# Patient Record
Sex: Male | Born: 1970 | Race: Black or African American | Hispanic: No | Marital: Married
Health system: Southern US, Community
[De-identification: ages and names within clinical notes are randomized; demographics above are authoritative.]

---

## 2014-07-22 ENCOUNTER — Emergency Department: Payer: Self-pay | Admitting: Emergency Medicine

## 2015-11-25 ENCOUNTER — Emergency Department
Admission: EM | Admit: 2015-11-25 | Discharge: 2015-11-25 | Disposition: A | Discharge status: Home / Self Care | Payer: BLUE CROSS/BLUE SHIELD | Attending: Emergency Medicine | Admitting: Emergency Medicine

## 2015-11-25 ENCOUNTER — Encounter: Payer: Self-pay | Admitting: Emergency Medicine

## 2015-11-25 DIAGNOSIS — H6092 Unspecified otitis externa, left ear: Secondary | ICD-10-CM | POA: Diagnosis not present

## 2015-11-25 DIAGNOSIS — H6123 Impacted cerumen, bilateral: Secondary | ICD-10-CM | POA: Insufficient documentation

## 2015-11-25 DIAGNOSIS — H9202 Otalgia, left ear: Secondary | ICD-10-CM | POA: Diagnosis present

## 2015-11-25 MED ORDER — NEOMYCIN-POLYMYXIN-HC 3.5-10000-1 OT SOLN: 3.0000 [drp] | Freq: Three times a day (TID) | OTIC | Status: AC

## 2015-11-25 NOTE — ED Notes (Signed)
Patient ambulatory to triage with steady gait, without difficulty or distress noted; pt reports left earache with muffled hearing since going swimming on Saturday

## 2015-11-25 NOTE — Discharge Instructions (Signed)
Ear Drops, Adult °You have been diagnosed with a condition requiring you to put drops of medicine into your outer ear. °HOME CARE INSTRUCTIONS  °· Put drops in the affected ear as instructed. After putting the drops in, you will need to lie down with the affected ear facing up for ten minutes so the drops will remain in the ear canal and run down and fill the canal. Continue using the ear drops for as long as directed by your health care provider. °· Prior to getting up, put a cotton ball gently in your ear canal. Leave enough of the cotton ball out so it can be easily removed. Do not attempt to push this down into the canal with a cotton-tipped swab or other instrument. °· Do not irrigate or wash out your ears if you have had a perforated eardrum or mastoid surgery, or unless instructed to do so by your health care provider. °· Keep appointments with your health care provider as instructed. °· Finish all medicine, or use for the length of time prescribed by your health care provider. Continue the drops even if your problem seems to be doing well after a couple days, or continue as instructed. °SEEK MEDICAL CARE IF: °· You become worse or develop increasing pain. °· You notice any unusual drainage from your ear (particularly if the drainage has a bad smell). °· You develop hearing difficulties. °· You experience a serious form of dizziness in which you feel as if the room is spinning, and you feel nauseated (vertigo). °· The outside of your ear becomes red or swollen or both. This may be a sign of an allergic reaction. °MAKE SURE YOU:  °· Understand these instructions. °· Will watch your condition. °· Will get help right away if you are not doing well or get worse. °  °This information is not intended to replace advice given to you by your health care provider. Make sure you discuss any questions you have with your health care provider. °  °Document Released: 05/09/2001 Document Revised: 06/05/2014 Document  Reviewed: 12/10/2012 °Elsevier Interactive Patient Education ©2016 [employer] ° °Otitis Externa °Otitis externa is a bacterial or fungal infection of the outer ear canal. This is the area from the eardrum to the outside of the ear. Otitis externa is sometimes called "swimmer's ear." °CAUSES  °Possible causes of infection include: °· Swimming in dirty water. °· Moisture remaining in the ear after swimming or bathing. °· Mild injury (trauma) to the ear. °· Objects stuck in the ear (foreign body). °· Cuts or scrapes (abrasions) on the outside of the ear. °SIGNS AND SYMPTOMS  °The first symptom of infection is often itching in the ear canal. Later signs and symptoms may include swelling and redness of the ear canal, ear pain, and yellowish-white fluid (pus) coming from the ear. The ear pain may be worse when pulling on the earlobe. °DIAGNOSIS  °Your health care provider will perform a physical exam. A sample of fluid may be taken from the ear and examined for bacteria or fungi. °TREATMENT  °Antibiotic ear drops are often given for 10 to 14 days. Treatment may also include pain medicine or corticosteroids to reduce itching and swelling. °HOME CARE INSTRUCTIONS  °· Apply antibiotic ear drops to the ear canal as prescribed by your health care provider. °· Take medicines only as directed by your health care provider. °· If you have diabetes, follow any additional treatment instructions from your health care provider. °· Keep all follow-up visits   as directed by your health care provider. °PREVENTION  °· Keep your ear dry. Use the corner of a towel to absorb water out of the ear canal after swimming or bathing. °· Avoid scratching or putting objects inside your ear. This can damage the ear canal or remove the protective wax that lines the canal. This makes it easier for bacteria and fungi to grow. °· Avoid swimming in lakes, polluted water, or poorly chlorinated pools. °· You may use ear drops made of rubbing alcohol and  vinegar after swimming. Combine equal parts of white vinegar and alcohol in a bottle. Put 3 or 4 drops into each ear after swimming. °SEEK MEDICAL CARE IF:  °· You have a fever. °· Your ear is still red, swollen, painful, or draining pus after 3 days. °· Your redness, swelling, or pain gets worse. °· You have a severe headache. °· You have redness, swelling, pain, or tenderness in the area behind your ear. °MAKE SURE YOU:  °· Understand these instructions. °· Will watch your condition. °· Will get help right away if you are not doing well or get worse. °  °This information is not intended to replace advice given to you by your health care provider. Make sure you discuss any questions you have with your health care provider. °  °Document Released: 05/15/2005 Document Revised: 06/05/2014 Document Reviewed: 06/01/2011 °Elsevier Interactive Patient Education ©2016 [employer] ° °Cerumen Impaction °The structures of the external ear canal secrete a waxy substance known as cerumen. Excess cerumen can build up in the ear canal, causing a condition known as cerumen impaction. Cerumen impaction can cause ear pain and disrupt the function of the ear. °The rate of cerumen production differs for each individual. In certain individuals, the configuration of the ear canal may decrease his or her ability to naturally remove cerumen. °CAUSES °Cerumen impaction is caused by excessive cerumen production or buildup. °RISK FACTORS °· Frequent use of swabs to clean ears. °· Having narrow ear canals. °· Having eczema. °· Being dehydrated. °SIGNS AND SYMPTOMS °· Diminished hearing. °· Ear drainage. °· Ear pain. °· Ear itch. °TREATMENT °Treatment may involve: °· Over-the-counter or prescription ear drops to soften the cerumen. °· Removal of cerumen by a health care provider. This may be done with: °¨ Irrigation with warm water. This is the most common method of removal. °¨ Ear curettes and other instruments. °¨ Surgery. This may be done  in severe cases. °HOME CARE INSTRUCTIONS °· Take medicines only as directed by your health care provider. °· Do not insert objects into the ear with the intent of cleaning the ear. °PREVENTION °· Do not insert objects into the ear, even with the intent of cleaning the ear. Removing cerumen as a part of normal hygiene is not necessary, and the use of swabs in the ear canal is not recommended. °· Drink enough water to keep your urine clear or pale yellow. °· Control your eczema if you have it. °SEEK MEDICAL CARE IF: °· You develop ear pain. °· You develop bleeding from the ear. °· The cerumen does not clear after you use ear drops as directed. °  °This information is not intended to replace advice given to you by your health care provider. Make sure you discuss any questions you have with your health care provider. °  °Document Released: 06/22/2004 Document Revised: 06/05/2014 Document Reviewed: 12/30/2014 °Elsevier Interactive Patient Education ©2016 [employer] ° °

## 2015-11-25 NOTE — ED Provider Notes (Signed)
[facility] Emergency Department Provider Note  ____________________________________________  Time seen: Approximately 8:38 PM  I have reviewed the triage vital signs and the nursing notes.   HISTORY  Chief Complaint Otalgia    HPI Russell Ligasric Lamont Okelley Sr. is a 45 y.o. male who presents emergency department complaining of left ear pain. Patient states that symptoms have been ongoing since he swam several days prior. Patient states that it has a full feeling to the ear as well as some muffled hearing. Patient denies any trauma to the ear. He denies sticking anything in his ears. Patient denies any headache, visual changes, fevers or chills, nasal congestion, sore throat. Patient has not tried any medications for this complaint prior to arrival.   History reviewed. No pertinent past medical history.  There are no active problems to display for this patient.   History reviewed. No pertinent past surgical history.  Current Outpatient Rx  Name  Route  Sig  Dispense  Refill  . neomycin-polymyxin-hydrocortisone (CORTISPORIN) otic solution   Both Ears   Place 3 drops into both ears 3 (three) times daily.   10 mL   0     Allergies Review of patient's allergies indicates not on file.  No family history on file.  Social History Social History  Substance Use Topics  . Smoking status: Never Smoker   . Smokeless tobacco: None  . Alcohol Use: No     Review of Systems  Constitutional: No fever/chills Eyes: No visual changes. No discharge ZOX:[id number]:Positive for left ear pain. Cardiovascular: no chest pain. Respiratory: no cough. No SOB. Skin: Negative for rash, abrasions, lacerations, ecchymosis. Neurological: Negative for headaches, focal weakness or numbness. 10-point ROS otherwise negative.  ____________________________________________   PHYSICAL EXAM:  VITAL SIGNS: ED Triage Vitals  Enc Vitals Group     BP 11/25/15 1949 127/93 mmHg     Pulse Rate  11/25/15 1949 106     Resp 11/25/15 1949 18     Temp 11/25/15 1949 98.3 F (36.8 C)     Temp Source 11/25/15 1949 Oral     SpO2 11/25/15 1949 97 %     Weight 11/25/15 1944 145 lb (65.772 kg)     Height 11/25/15 1944 5\' 9"  (1.753 m)     Head Cir --      Peak Flow --      Pain Score 11/25/15 1943 8     Pain Loc --      Pain Edu? --      Excl. in GC? --      Constitutional: Alert and oriented. Well appearing and in no acute distress. Eyes: Conjunctivae are normal. PERRL. EOMI. Head: Atraumatic. ENT:      Ears: EACs with significant amounts of wax bilaterally. EAC on left is erythematous and edematous. Patient is tender to palpation over the tragus left side. TMs are visualized bilaterally. TM on right is unremarkable. Visualized portion of TM on left is nonerythematous and nonbulging.      Nose: No congestion/rhinnorhea.      Mouth/Throat: Mucous membranes are moist.  Neck: No stridor.   Hematological/Lymphatic/Immunilogical: No cervical lymphadenopathy Cardiovascular: Normal rate, regular rhythm. Normal S1 and S2.  Good peripheral circulation. Respiratory: Normal respiratory effort without tachypnea or retractions. Lungs CTAB. Good air entry to the bases with no decreased or absent breath sounds. Musculoskeletal: Full range of motion to all extremities. No gross deformities appreciated. Neurologic:  Normal speech and language. No gross focal neurologic deficits are appreciated.  Skin:  Skin is warm, dry and intact. No rash noted. Psychiatric: Mood and affect are normal. Speech and behavior are normal. Patient exhibits appropriate insight and judgement.   ____________________________________________   LABS (all labs ordered are listed, but only abnormal results are displayed)  Labs Reviewed - No data to display ____________________________________________  EKG   ____________________________________________  RADIOLOGY   No results  found.  ____________________________________________    PROCEDURES  Procedure(s) performed:       Medications - No data to display   ____________________________________________   INITIAL IMPRESSION / ASSESSMENT AND PLAN / ED COURSE  Pertinent labs & imaging results that were available during my care of the patient were reviewed by me and considered in my medical decision making (see chart for details).  Patient's diagnosis is consistent with left-sided otitis externa and significant amounts of cerumen bilaterally. Patient exam is reassuring and no labs or imaging as deemed necessary at this time. Patient will be discharged home with prescriptions for antibiotic eardrops. Patient is encouraged to use cerumen cleaning eardrops as well.. Patient is to follow up with primary care provider as needed or otherwise directed. Patient is given ED precautions to return to the ED for any worsening or new symptoms.     ____________________________________________  FINAL CLINICAL IMPRESSION(S) / ED DIAGNOSES  Final diagnoses:  Otitis externa, left  Excessive cerumen in both ear canals      NEW MEDICATIONS STARTED DURING THIS VISIT:  New Prescriptions   NEOMYCIN-POLYMYXIN-HYDROCORTISONE (CORTISPORIN) OTIC SOLUTION    Place 3 drops into both ears 3 (three) times daily.        This chart was dictated using voice recognition software/Dragon. Despite best efforts to proofread, errors can occur which can change the meaning. Any change was purely unintentional.    Racheal PatchesJonathan D Cuthriell, PA-C 11/25/15 2046  Minna AntisKevin Paduchowski, MD 11/26/15 2258

## 2017-02-02 ENCOUNTER — Emergency Department
Admission: EM | Admit: 2017-02-02 | Discharge: 2017-02-02 | Disposition: A | Discharge status: Home / Self Care | Payer: BLUE CROSS/BLUE SHIELD | Attending: Emergency Medicine | Admitting: Emergency Medicine

## 2017-02-02 ENCOUNTER — Encounter: Payer: Self-pay | Admitting: Emergency Medicine

## 2017-02-02 DIAGNOSIS — K047 Periapical abscess without sinus: Secondary | ICD-10-CM

## 2017-02-02 DIAGNOSIS — K029 Dental caries, unspecified: Secondary | ICD-10-CM | POA: Insufficient documentation

## 2017-02-02 MED ORDER — AMOXICILLIN 500 MG PO CAPS: 500.0000 mg | ORAL_CAPSULE | Freq: Three times a day (TID) | ORAL | 0 refills | Status: AC

## 2017-02-02 MED ORDER — TRAMADOL HCL 50 MG PO TABS
50.0000 mg | ORAL_TABLET | Freq: Four times a day (QID) | ORAL | 0 refills | Status: AC | PRN
Start: 2017-02-02 — End: ?

## 2017-02-02 NOTE — Discharge Instructions (Signed)
Call your dentist on Monday for an appointment.  Take amoxil 3 times a day for 10 days and tramadol as needed for pain every 6 hours.  You may continue to take ibuprofen for pain also with this medication

## 2017-02-02 NOTE — ED Provider Notes (Signed)
[facility]  ___________________________________________   First MD Initiated Contact with Patient 02/02/17 1136     (approximate)  I have reviewed the triage vital signs and the nursing notes.   HISTORY  Chief Complaint Dental Pain    HPI Russell Ligasric Lamont Amendola Sr. is a 46 y.o. male this is a complaint of dental pain and facial swelling. Patient states that he saw a dentist approximately 4-5 months ago and was put on antibiotic and told that he needed multiple extractions. Patient has not been back since that time. He has been taking over-the-counter ibuprofen with minimal relief. Patient rates his pain as 6/10.  History reviewed. No pertinent past medical history.  There are no active problems to display for this patient.   History reviewed. No pertinent surgical history.  Prior to Admission medications   Medication Sig Start Date End Date Taking? Authorizing Provider  amoxicillin (AMOXIL) 500 MG capsule Take 1 capsule (500 mg total) by mouth 3 (three) times daily. 02/02/17   Tommi RumpsSummers, Rhonda L, PA-C  traMADol (ULTRAM) 50 MG tablet Take 1 tablet (50 mg total) by mouth every 6 (six) hours as needed for moderate pain. 02/02/17   Tommi RumpsSummers, Rhonda L, PA-C    Allergies Patient has no known allergies.  No family history on file.  Social History Social History  Substance Use Topics  . Smoking status: Never Smoker  . Smokeless tobacco: Never Used  . Alcohol use Yes     Comment: occasional    Review of Systems Constitutional: No fever/chills ENT: No sore throat. Positive dental pain. Cardiovascular: Denies chest pain. Respiratory: Denies shortness of breath. Gastrointestinal:   No nausea, no vomiting.  Neurological: Negative for headaches ____________________________________________   PHYSICAL EXAM:  VITAL SIGNS: ED Triage Vitals  Enc Vitals Group     BP      Pulse      Resp      Temp      Temp src      SpO2       Weight      Height      Head Circumference      Peak Flow      Pain Score      Pain Loc      Pain Edu?      Excl. in GC?     Constitutional: Alert and oriented. Well appearing and in no acute distress. Eyes: Conjunctivae are normal.  Head: Atraumatic. Mouth/Throat: Mucous membranes are moist.  Oropharynx non-erythematous. Moderate swelling is noted on the left lower gum. Teeth are in very poor repair and tender to touch with a tongue depressor. No obvious abscess is seen and no drainage noted. Mild facial swelling on the left is noted. Neck: No stridor.   Hematological/Lymphatic/Immunilogical: No cervical lymphadenopathy. Cardiovascular: Normal rate, regular rhythm. Grossly normal heart sounds.  Good peripheral circulation. Respiratory: Normal respiratory effort.  No retractions. Lungs CTAB. Musculoskeletal: No lower extremity tenderness nor edema.  No joint effusions. Neurologic:  Normal speech and language. No gross focal neurologic deficits are appreciated. No gait instability. Skin:  Skin is warm, dry and intact.  Psychiatric: Mood and affect are normal. Speech and behavior are normal.  ____________________________________________   LABS (all labs ordered are listed, but only abnormal results are displayed)  Labs Reviewed - No data to display  PROCEDURES  Procedure(s) performed: None  Procedures  Critical Care performed: No  ____________________________________________   INITIAL IMPRESSION / ASSESSMENT AND PLAN /  ED COURSE  Pertinent labs & imaging results that were available during my care of the patient were reviewed by me and considered in my medical decision making (see chart for details).  Patient was given a prescription for amoxicillin 500 mg 3 times a day with food. He is also given a prescription for tramadol No. 10 one every 8 hours as needed for moderate pain. Patient has been taking ibuprofen and will continue to do so for mild pain. He is  instructed to call his dentist on Monday for an appointment.   ___________________________________________   FINAL CLINICAL IMPRESSION(S) / ED DIAGNOSES  Final diagnoses:  Dental abscess  Pain due to dental caries      NEW MEDICATIONS STARTED DURING THIS VISIT:  Discharge Medication List as of 02/02/2017 12:02 PM    START taking these medications   Details  amoxicillin (AMOXIL) 500 MG capsule Take 1 capsule (500 mg total) by mouth 3 (three) times daily., Starting Fri 02/02/2017, Print    traMADol (ULTRAM) 50 MG tablet Take 1 tablet (50 mg total) by mouth every 6 (six) hours as needed for moderate pain., Starting Fri 02/02/2017, Print         Note:  This document was prepared using Dragon voice recognition software and may include unintentional dictation errors.    Tommi Rumps, PA-C 02/02/17 1243    Loleta Rose, MD 02/02/17 1318

## 2017-02-02 NOTE — ED Triage Notes (Signed)
presents with dental pain  states he lost his crown now having increased pain and swelling a few days ago  States min relief with OTC ibu

## 2017-03-12 ENCOUNTER — Emergency Department: Payer: Self-pay

## 2017-03-12 ENCOUNTER — Emergency Department
Admission: EM | Admit: 2017-03-12 | Discharge: 2017-03-12 | Disposition: A | Discharge status: Home / Self Care | Payer: Self-pay | Attending: Emergency Medicine | Admitting: Emergency Medicine

## 2017-03-12 DIAGNOSIS — Y939 Activity, unspecified: Secondary | ICD-10-CM | POA: Insufficient documentation

## 2017-03-12 DIAGNOSIS — S39012A Strain of muscle, fascia and tendon of lower back, initial encounter: Secondary | ICD-10-CM | POA: Insufficient documentation

## 2017-03-12 DIAGNOSIS — W010XXA Fall on same level from slipping, tripping and stumbling without subsequent striking against object, initial encounter: Secondary | ICD-10-CM | POA: Insufficient documentation

## 2017-03-12 DIAGNOSIS — Y929 Unspecified place or not applicable: Secondary | ICD-10-CM | POA: Insufficient documentation

## 2017-03-12 DIAGNOSIS — Y999 Unspecified external cause status: Secondary | ICD-10-CM | POA: Insufficient documentation

## 2017-03-12 DIAGNOSIS — S8002XA Contusion of left knee, initial encounter: Secondary | ICD-10-CM | POA: Insufficient documentation

## 2017-03-12 MED ORDER — CYCLOBENZAPRINE HCL 10 MG PO TABS: 10.0000 mg | ORAL_TABLET | Freq: Three times a day (TID) | ORAL | 0 refills | Status: AC | PRN

## 2017-03-12 MED ORDER — IBUPROFEN 600 MG PO TABS: 600.0000 mg | ORAL_TABLET | Freq: Three times a day (TID) | ORAL | 0 refills | Status: AC | PRN

## 2017-03-12 MED ORDER — TRAMADOL HCL 50 MG PO TABS: 50.0000 mg | ORAL_TABLET | Freq: Two times a day (BID) | ORAL | 0 refills | Status: AC | PRN

## 2017-03-12 NOTE — ED Triage Notes (Signed)
Pt states "knee gave out" and caused fall on Friday. Right knee pain and lower back pain since Friday. Pt walks with limp. Pt alert and oriented X4, active, cooperative, pt in NAD. RR even and unlabored, color WNL.  Took BC PTA.

## 2017-03-12 NOTE — ED Provider Notes (Signed)
[facility] Emergency Department Provider Note   ____________________________________________   First MD Initiated Contact with Patient 03/12/17 1047     (approximate)  I have reviewed the triage vital signs and the nursing notes.   HISTORY  Chief Complaint Back Pain    HPI Russell Vorndran Sr. is a 46 y.o. male patient complaining of low back pain and right knee pain secondary to a fall. Patient say his right knee gave out he fell landing on his left knee. Patient's restraints back on the fall. Incident occurred 3 days ago. Patient stated no relief with BC powder. Patient denies radicular component to his back pain. Patient has about a bowel dysfunction. Patient states pain increase with weightbearing. Patient rates his pain as 8/10. Patient described a pain as "achy".   History reviewed. No pertinent past medical history.  There are no active problems to display for this patient.   History reviewed. No pertinent surgical history.  Prior to Admission medications   Medication Sig Start Date End Date Taking? Authorizing Provider  amoxicillin (AMOXIL) 500 MG capsule Take 1 capsule (500 mg total) by mouth 3 (three) times daily. 02/02/17   Tommi Rumps, PA-C  cyclobenzaprine (FLEXERIL) 10 MG tablet Take 1 tablet (10 mg total) by mouth 3 (three) times daily as needed. 03/12/17   Joni Reining, PA-C  ibuprofen (ADVIL,MOTRIN) 600 MG tablet Take 1 tablet (600 mg total) by mouth every 8 (eight) hours as needed. 03/12/17   Joni Reining, PA-C  traMADol (ULTRAM) 50 MG tablet Take 1 tablet (50 mg total) by mouth every 6 (six) hours as needed for moderate pain. 02/02/17   Tommi Rumps, PA-C  traMADol (ULTRAM) 50 MG tablet Take 1 tablet (50 mg total) by mouth every 12 (twelve) hours as needed. 03/12/17   Joni Reining, PA-C    Allergies Patient has no known allergies.  No family history on file.  Social History Social History  Substance Use Topics   . Smoking status: Never Smoker  . Smokeless tobacco: Never Used  . Alcohol use Yes     Comment: occasional    Review of Systems Constitutional: No fever/chills Eyes: No visual changes. ENT: No sore throat. Cardiovascular: Denies chest pain. Respiratory: Denies shortness of breath. Gastrointestinal: No abdominal pain.  No nausea, no vomiting.  No diarrhea.  No constipation. Genitourinary: Negative for dysuria. Musculoskeletal: positives for back and right knee pain Skin: Negative for rash. Neurological: Negative for headaches, focal weakness or numbness.   ____________________________________________   PHYSICAL EXAM:  VITAL SIGNS: ED Triage Vitals [03/12/17 1024]  Enc Vitals Group     BP (!) 142/97     Pulse Rate (!) 103     Resp 18     Temp 98.2 F (36.8 C)     Temp Source Oral     SpO2 98 %     Weight 145 lb (65.8 kg)     Height 6' (1.829 m)     Head Circumference      Peak Flow      Pain Score 8     Pain Loc      Pain Edu?      Excl. in GC?    Constitutional: Alert and oriented. Well appearing and in no acute distress. Cardiovascular: Normal rate, regular rhythm. Grossly normal heart sounds.  Good peripheral circulation. Respiratory: Normal respiratory effort.  No retractions. Lungs CTAB. Musculoskeletal: no spinal deformity. Patient is no guarding palpation spinal process.  Patient decreased range of motion with flexion and lateral movements. Patient has negative straight leg test. Examination knee shows no obvious deformity. Mild edema. Mild crepitus to palpation. Full and equal range of motion. Neurologic:  Normal speech and language. No gross focal neurologic deficits are appreciated. No gait instability. Skin:  Skin is warm, dry and intact. No rash noted. Psychiatric: Mood and affect are normal. Speech and behavior are normal.  ____________________________________________   LABS (all labs ordered are listed, but only abnormal results are  displayed)  Labs Reviewed - No data to display ____________________________________________  EKG   ____________________________________________  RADIOLOGY  Dg Knee Complete 4 Views Left  Result Date: 03/12/2017 CLINICAL DATA:  Pt reported to have been walking when he says his right knee gave out and he fell onto his left. Pt's knee appears to be swollen and he had difficulty moving from internal to external position. EXAM: LEFT KNEE - COMPLETE 4+ VIEW COMPARISON:  None. FINDINGS: No evidence of fracture, dislocation, or joint effusion. Mild left medial femorotibial compartment joint space narrowing. Soft tissue swelling over the medial aspect of the left knee. IMPRESSION: 1.  No acute osseous injury of the left knee. 2. Mild soft tissue swelling over the medial aspect of the knee joint. Electronically Signed   By: Elige Ko   On: 03/12/2017 11:15   no acute findings x-ray of the left knee.  ____________________________________________   PROCEDURES  Procedure(s) performed: None  Procedures  Critical Care performed: No  ____________________________________________   INITIAL IMPRESSION / ASSESSMENT AND PLAN / ED COURSE  As part of my medical decision making, I reviewed the following data within the electronic MEDICAL RECORD NUMBER    Lumbar strain and left knee contusion. Discussed x-ray finding with patient. Patient given discharge care instructions. Patient given return to work note. Patient advised to take medication as directed [facility].      ____________________________________________   FINAL CLINICAL IMPRESSION(S) / ED DIAGNOSES  Final diagnoses:  Contusion of left knee, initial encounter  Strain of lumbar region, initial encounter      NEW MEDICATIONS STARTED DURING THIS VISIT:  New Prescriptions   CYCLOBENZAPRINE (FLEXERIL) 10 MG TABLET    Take 1 tablet (10 mg total) by mouth 3 (three) times daily as needed.    IBUPROFEN (ADVIL,MOTRIN) 600 MG TABLET    Take 1 tablet (600 mg total) by mouth every 8 (eight) hours as needed.   TRAMADOL (ULTRAM) 50 MG TABLET    Take 1 tablet (50 mg total) by mouth every 12 (twelve) hours as needed.     Note:  This document was prepared using Dragon voice recognition software and may include unintentional dictation errors.    Joni Reining, PA-C 03/12/17 1147    Jene Every, MD 03/12/17 [phone]

## 2020-02-19 ENCOUNTER — Emergency Department (HOSPITAL_COMMUNITY): Payer: No Typology Code available for payment source | Admitting: Certified Registered Nurse Anesthetist

## 2020-02-19 ENCOUNTER — Emergency Department (HOSPITAL_COMMUNITY): Payer: No Typology Code available for payment source

## 2020-02-19 ENCOUNTER — Encounter (HOSPITAL_COMMUNITY)
Admission: EM | Disposition: A | Discharge status: Home / Self Care | Payer: Self-pay | Source: Home / Self Care | Attending: Orthopaedic Surgery

## 2020-02-19 ENCOUNTER — Inpatient Hospital Stay (HOSPITAL_COMMUNITY)
Admission: EM | Admit: 2020-02-19 | Discharge: 2020-02-20 | DRG: 465 | Disposition: A | Discharge status: Home / Self Care | Payer: No Typology Code available for payment source | Attending: Orthopaedic Surgery | Admitting: Orthopaedic Surgery

## 2020-02-19 DIAGNOSIS — Y92410 Unspecified street and highway as the place of occurrence of the external cause: Secondary | ICD-10-CM | POA: Diagnosis not present

## 2020-02-19 DIAGNOSIS — T1490XA Injury, unspecified, initial encounter: Secondary | ICD-10-CM

## 2020-02-19 DIAGNOSIS — S81012A Laceration without foreign body, left knee, initial encounter: Secondary | ICD-10-CM | POA: Diagnosis present

## 2020-02-19 DIAGNOSIS — R202 Paresthesia of skin: Secondary | ICD-10-CM | POA: Diagnosis present

## 2020-02-19 DIAGNOSIS — Z23 Encounter for immunization: Secondary | ICD-10-CM

## 2020-02-19 DIAGNOSIS — Z7289 Other problems related to lifestyle: Secondary | ICD-10-CM | POA: Diagnosis not present

## 2020-02-19 DIAGNOSIS — Z20822 Contact with and (suspected) exposure to covid-19: Secondary | ICD-10-CM | POA: Diagnosis present

## 2020-02-19 DIAGNOSIS — M2540 Effusion, unspecified joint: Secondary | ICD-10-CM | POA: Diagnosis present

## 2020-02-19 DIAGNOSIS — F1910 Other psychoactive substance abuse, uncomplicated: Secondary | ICD-10-CM | POA: Diagnosis present

## 2020-02-19 DIAGNOSIS — R200 Anesthesia of skin: Secondary | ICD-10-CM | POA: Diagnosis present

## 2020-02-19 DIAGNOSIS — S52272B Monteggia's fracture of left ulna, initial encounter for open fracture type I or II: Principal | ICD-10-CM | POA: Diagnosis present

## 2020-02-19 DIAGNOSIS — S5292XB Unspecified fracture of left forearm, initial encounter for open fracture type I or II: Secondary | ICD-10-CM

## 2020-02-19 HISTORY — PX: ORIF ULNAR FRACTURE: SHX5417

## 2020-02-19 LAB — COMPREHENSIVE METABOLIC PANEL
ALT: 30 U/L (ref 0–44)
AST: 90 U/L — ABNORMAL HIGH (ref 15–41)
Albumin: 3.8 g/dL (ref 3.5–5.0)
Alkaline Phosphatase: 68 U/L (ref 38–126)
Anion gap: 17 — ABNORMAL HIGH (ref 5–15)
BUN: 6 mg/dL (ref 6–20)
CO2: 19 mmol/L — ABNORMAL LOW (ref 22–32)
Calcium: 8.8 mg/dL — ABNORMAL LOW (ref 8.9–10.3)
Chloride: 98 mmol/L (ref 98–111)
Creatinine, Ser: 0.71 mg/dL (ref 0.61–1.24)
GFR calc Af Amer: >60 mL/min (ref >60)
GFR calc non Af Amer: >60 mL/min (ref >60)
Glucose, Bld: 126 mg/dL — ABNORMAL HIGH (ref 70–99)
Potassium: 3.8 mmol/L (ref 3.5–5.1)
Sodium: 134 mmol/L — ABNORMAL LOW (ref 135–145)
Total Bilirubin: 1 mg/dL (ref 0.3–1.2)
Total Protein: 7.5 g/dL (ref 6.5–8.1)

## 2020-02-19 LAB — I-STAT CHEM 8, ED
BUN: 4 mg/dL — ABNORMAL LOW (ref 6–20)
Calcium, Ion: 1.04 mmol/L — ABNORMAL LOW (ref 1.15–1.40)
Chloride: 99 mmol/L (ref 98–111)
Creatinine, Ser: 0.8 mg/dL (ref 0.61–1.24)
Glucose, Bld: 127 mg/dL — ABNORMAL HIGH (ref 70–99)
HCT: 41 % (ref 39.0–52.0)
Hemoglobin: 13.9 g/dL (ref 13.0–17.0)
Potassium: 3.9 mmol/L (ref 3.5–5.1)
Sodium: 134 mmol/L — ABNORMAL LOW (ref 135–145)
TCO2: 22 mmol/L (ref 22–32)

## 2020-02-19 LAB — CBC
HCT: 38.8 % — ABNORMAL LOW (ref 39.0–52.0)
Hemoglobin: 12.1 g/dL — ABNORMAL LOW (ref 13.0–17.0)
MCH: 31.3 pg (ref 26.0–34.0)
MCHC: 31.2 g/dL (ref 30.0–36.0)
MCV: 100.5 fL — ABNORMAL HIGH (ref 80.0–100.0)
Platelets: 271 10*3/uL (ref 150–400)
RBC: 3.86 MIL/uL — ABNORMAL LOW (ref 4.22–5.81)
RDW: 11.9 % (ref 11.5–15.5)
WBC: 8.3 10*3/uL (ref 4.0–10.5)
nRBC: 0 % (ref 0.0–0.2)

## 2020-02-19 LAB — RESPIRATORY PANEL BY RT PCR (FLU A&B, COVID)
Influenza A by PCR: NEGATIVE
Influenza B by PCR: NEGATIVE
SARS Coronavirus 2 by RT PCR: NEGATIVE

## 2020-02-19 LAB — LACTIC ACID, PLASMA: Lactic Acid, Venous: 5.1 mmol/L (ref 0.5–1.9)

## 2020-02-19 SURGERY — OPEN REDUCTION INTERNAL FIXATION (ORIF) ULNAR FRACTURE
Anesthesia: General | Laterality: Left

## 2020-02-19 MED ORDER — SUCCINYLCHOLINE CHLORIDE 20 MG/ML IJ SOLN
INTRAMUSCULAR | Status: DC | PRN
  Administered 2020-02-19: 150 mg via INTRAVENOUS

## 2020-02-19 MED ORDER — FENTANYL CITRATE (PF) 250 MCG/5ML IJ SOLN
INTRAMUSCULAR | Status: AC
Start: 2020-02-19 — End: ?
  Filled 2020-02-19: qty 5

## 2020-02-19 MED ORDER — PROPOFOL 10 MG/ML IV BOLUS
INTRAVENOUS | Status: AC
  Filled 2020-02-19: qty 20

## 2020-02-19 MED ORDER — HYDROMORPHONE HCL 1 MG/ML IJ SOLN
INTRAMUSCULAR | Status: AC
  Filled 2020-02-19: qty 1

## 2020-02-19 MED ORDER — BUPIVACAINE HCL (PF) 0.25 % IJ SOLN
INTRAMUSCULAR | Status: AC
  Filled 2020-02-19: qty 30

## 2020-02-19 MED ORDER — LIDOCAINE HCL (CARDIAC) PF 100 MG/5ML IV SOSY
PREFILLED_SYRINGE | INTRAVENOUS | Status: DC | PRN
  Administered 2020-02-19: 40 mg via INTRAVENOUS

## 2020-02-19 MED ORDER — ROCURONIUM BROMIDE 100 MG/10ML IV SOLN
INTRAVENOUS | Status: DC | PRN
  Administered 2020-02-19: 40 mg via INTRAVENOUS

## 2020-02-19 MED ORDER — PROMETHAZINE HCL 25 MG/ML IJ SOLN: 6.2500 mg | INTRAMUSCULAR | Status: DC | PRN

## 2020-02-19 MED ORDER — MEPERIDINE HCL 25 MG/ML IJ SOLN: 6.2500 mg | INTRAMUSCULAR | Status: DC | PRN

## 2020-02-19 MED ORDER — HYDROMORPHONE HCL 1 MG/ML IJ SOLN
INTRAMUSCULAR | Status: AC
Start: 2020-02-19 — End: 2020-02-20
  Filled 2020-02-19: qty 1

## 2020-02-19 MED ORDER — CEFAZOLIN SODIUM-DEXTROSE 2-4 GM/100ML-% IV SOLN
2.0000 g | Freq: Once | INTRAVENOUS | Status: AC
  Administered 2020-02-19: 2 g via INTRAVENOUS

## 2020-02-19 MED ORDER — OXYCODONE HCL 5 MG/5ML PO SOLN: 5.0000 mg | Freq: Once | ORAL | Status: AC | PRN

## 2020-02-19 MED ORDER — DEXAMETHASONE SODIUM PHOSPHATE 4 MG/ML IJ SOLN
INTRAMUSCULAR | Status: DC | PRN
  Administered 2020-02-19: 5 mg via INTRAVENOUS

## 2020-02-19 MED ORDER — HYDROMORPHONE HCL 1 MG/ML IJ SOLN
0.2500 mg | INTRAMUSCULAR | Status: DC | PRN
  Administered 2020-02-19 (×4): 0.5 mg via INTRAVENOUS

## 2020-02-19 MED ORDER — LABETALOL HCL 5 MG/ML IV SOLN
INTRAVENOUS | Status: AC
  Filled 2020-02-19: qty 4

## 2020-02-19 MED ORDER — OXYCODONE HCL 5 MG PO TABS
5.0000 mg | ORAL_TABLET | Freq: Once | ORAL | Status: AC | PRN
  Administered 2020-02-19: 5 mg via ORAL

## 2020-02-19 MED ORDER — OXYCODONE HCL 5 MG PO TABS
ORAL_TABLET | ORAL | Status: AC
  Filled 2020-02-19: qty 1

## 2020-02-19 MED ORDER — FENTANYL CITRATE (PF) 250 MCG/5ML IJ SOLN
INTRAMUSCULAR | Status: AC
  Filled 2020-02-19: qty 5

## 2020-02-19 MED ORDER — PROPOFOL 10 MG/ML IV BOLUS
INTRAVENOUS | Status: DC | PRN
  Administered 2020-02-19: 150 mg via INTRAVENOUS

## 2020-02-19 MED ORDER — IOHEXOL 300 MG/ML  SOLN
100.0000 mL | Freq: Once | INTRAMUSCULAR | Status: AC | PRN
  Administered 2020-02-19: 100 mL via INTRAVENOUS

## 2020-02-19 MED ORDER — SUGAMMADEX SODIUM 200 MG/2ML IV SOLN
INTRAVENOUS | Status: DC | PRN
  Administered 2020-02-19: 150 mg via INTRAVENOUS

## 2020-02-19 MED ORDER — CEFAZOLIN SODIUM-DEXTROSE 2-3 GM-%(50ML) IV SOLR
INTRAVENOUS | Status: DC | PRN
  Administered 2020-02-19: 2 g via INTRAVENOUS

## 2020-02-19 MED ORDER — MIDAZOLAM HCL 5 MG/5ML IJ SOLN
INTRAMUSCULAR | Status: DC | PRN
  Administered 2020-02-19: 2 mg via INTRAVENOUS

## 2020-02-19 MED ORDER — MIDAZOLAM HCL 2 MG/2ML IJ SOLN
INTRAMUSCULAR | Status: AC
  Filled 2020-02-19: qty 2

## 2020-02-19 MED ORDER — FENTANYL CITRATE (PF) 100 MCG/2ML IJ SOLN
50.0000 ug | Freq: Once | INTRAMUSCULAR | Status: AC
  Administered 2020-02-19: 50 ug via INTRAVENOUS

## 2020-02-19 MED ORDER — LACTATED RINGERS IV SOLN: INTRAVENOUS | Status: DC | PRN

## 2020-02-19 MED ORDER — FENTANYL CITRATE (PF) 100 MCG/2ML IJ SOLN
INTRAMUSCULAR | Status: DC | PRN
Start: 2020-02-19 — End: 2020-02-19
  Administered 2020-02-19 (×2): 100 ug via INTRAVENOUS
  Administered 2020-02-19 (×3): 50 ug via INTRAVENOUS

## 2020-02-19 MED ORDER — LABETALOL HCL 5 MG/ML IV SOLN
5.0000 mg | INTRAVENOUS | Status: DC | PRN
  Administered 2020-02-19: 5 mg via INTRAVENOUS
  Administered 2020-02-20: 10 mg via INTRAVENOUS
  Administered 2020-02-20: 5 mg via INTRAVENOUS

## 2020-02-19 MED ORDER — ONDANSETRON HCL 4 MG/2ML IJ SOLN
INTRAMUSCULAR | Status: DC | PRN
  Administered 2020-02-19: 4 mg via INTRAVENOUS

## 2020-02-19 MED ORDER — MIDAZOLAM HCL 2 MG/2ML IJ SOLN
0.5000 mg | Freq: Once | INTRAMUSCULAR | Status: AC | PRN
  Administered 2020-02-20: 1 mg via INTRAVENOUS

## 2020-02-19 MED ORDER — TETANUS-DIPHTH-ACELL PERTUSSIS 5-2.5-18.5 LF-MCG/0.5 IM SUSP
0.5000 mL | Freq: Once | INTRAMUSCULAR | Status: AC
  Administered 2020-02-19: 0.5 mL via INTRAMUSCULAR

## 2020-02-19 MED ORDER — SODIUM CHLORIDE 0.9 % IR SOLN
Status: DC | PRN
  Administered 2020-02-19: 1000 mL
  Administered 2020-02-19 (×2): 3000 mL

## 2020-02-19 MED ORDER — LACTATED RINGERS IV SOLN: INTRAVENOUS | Status: DC

## 2020-02-19 SURGICAL SUPPLY — 63 items
BIT DRILL 110X2.5XQCK CNCT (BIT) ×1 IMPLANT
BIT DRILL 2.5 (BIT) ×2
BIT DRL 110X2.5XQCK CNCT (BIT) ×1
BLADE 10 SAFETY STRL DISP (BLADE) ×6 IMPLANT
BLADE 15 SAFETY STRL DISP (BLADE) ×6 IMPLANT
BNDG COHESIVE 6X5 TAN STRL LF (GAUZE/BANDAGES/DRESSINGS) ×3 IMPLANT
BNDG ELASTIC 4X5.8 VLCR STR LF (GAUZE/BANDAGES/DRESSINGS) ×3 IMPLANT
BNDG ELASTIC 6X5.8 VLCR STR LF (GAUZE/BANDAGES/DRESSINGS) ×6 IMPLANT
BNDG ESMARK 4X9 LF (GAUZE/BANDAGES/DRESSINGS) ×3 IMPLANT
BNDG GAUZE ELAST 4 BULKY (GAUZE/BANDAGES/DRESSINGS) ×9 IMPLANT
CORD BIPOLAR FORCEPS 12FT (ELECTRODE) ×3 IMPLANT
COVER SURGICAL LIGHT HANDLE (MISCELLANEOUS) ×3 IMPLANT
CUFF TOURN SGL QUICK 24 (TOURNIQUET CUFF) ×2
CUFF TRNQT CYL 24X4X16.5-23 (TOURNIQUET CUFF) ×1 IMPLANT
DRAPE EXTREMITY T 121X128X90 (DISPOSABLE) ×3 IMPLANT
DRAPE HALF SHEET 40X57 (DRAPES) ×3 IMPLANT
DRAPE IMP U-DRAPE 54X76 (DRAPES) ×3 IMPLANT
DRAPE OEC MINIVIEW 54X84 (DRAPES) ×3 IMPLANT
DRAPE U-SHAPE 47X51 STRL (DRAPES) ×6 IMPLANT
DRSG PAD ABDOMINAL 8X10 ST (GAUZE/BANDAGES/DRESSINGS) ×6 IMPLANT
DURAPREP 26ML APPLICATOR (WOUND CARE) ×6 IMPLANT
ELECT REM PT RETURN 9FT ADLT (ELECTROSURGICAL) ×3
ELECTRODE REM PT RTRN 9FT ADLT (ELECTROSURGICAL) ×1 IMPLANT
GAUZE SPONGE 4X4 12PLY STRL (GAUZE/BANDAGES/DRESSINGS) ×6 IMPLANT
GAUZE SPONGE 4X4 12PLY STRL LF (GAUZE/BANDAGES/DRESSINGS) ×3 IMPLANT
GAUZE XEROFORM 5X9 LF (GAUZE/BANDAGES/DRESSINGS) ×6 IMPLANT
GOWN STRL REIN XL XLG (GOWN DISPOSABLE) ×3 IMPLANT
K-WIRE TROCAR 1.6X150 (WIRE) ×3
KIT BASIN OR (CUSTOM PROCEDURE TRAY) ×3 IMPLANT
KWIRE TROCAR 1.6X150 (WIRE) ×1 IMPLANT
MIX DBX 10CC 35% BONE (Bone Implant) ×3 IMPLANT
NEEDLE 18GX1X1/2 (RX/OR ONLY) (NEEDLE) ×3 IMPLANT
NEEDLE 22X1 1/2 (OR ONLY) (NEEDLE) ×3 IMPLANT
PACK ORTHO EXTREMITY (CUSTOM PROCEDURE TRAY) ×3 IMPLANT
PAD ABD 7.5X8 STRL (GAUZE/BANDAGES/DRESSINGS) ×3 IMPLANT
PIN SAFETY STERILE (MISCELLANEOUS) ×3 IMPLANT
PLATE BONE DUAL COMP 14H 183MM (Plate) ×3 IMPLANT
SCREW CORT 2.5X28X3.5XST SM (Screw) ×1 IMPLANT
SCREW CORT 2.5X34X3.5XST SM (Screw) ×1 IMPLANT
SCREW CORTICAL 3.5 16MM (Screw) ×12 IMPLANT
SCREW CORTICAL 3.5X26 (Screw) ×3 IMPLANT
SCREW CORTICAL 3.5X28MM (Screw) ×2 IMPLANT
SCREW CORTICAL 3.5X34MM (Screw) ×2 IMPLANT
SET IRRIG Y TYPE TUR BLADDER L (SET/KITS/TRAYS/PACK) ×3 IMPLANT
SLING ARM FOAM STRAP LRG (SOFTGOODS) ×3 IMPLANT
SPONGE LAP 18X18 RF (DISPOSABLE) ×3 IMPLANT
STOCKINETTE IMPERVIOUS LG (DRAPES) ×3 IMPLANT
SUT ETHILON 2 0 FS 18 (SUTURE) ×6 IMPLANT
SUT ETHILON 3 0 PS 1 (SUTURE) ×9 IMPLANT
SUT ETHILON 4 0 PS 2 18 (SUTURE) ×3 IMPLANT
SUT MNCRL AB 4-0 PS2 18 (SUTURE) ×3 IMPLANT
SUT PROLENE 3 0 PS 2 (SUTURE) ×3 IMPLANT
SUT VIC AB 0 CT1 27 (SUTURE) ×4
SUT VIC AB 0 CT1 27XBRD ANBCTR (SUTURE) ×2 IMPLANT
SUT VIC AB 2-0 CT1 27 (SUTURE) ×8
SUT VIC AB 2-0 CT1 TAPERPNT 27 (SUTURE) ×4 IMPLANT
SUT VIC AB 2-0 SH 27 (SUTURE) ×4
SUT VIC AB 2-0 SH 27X BRD (SUTURE) ×2 IMPLANT
SUT VIC AB 3-0 FS2 27 (SUTURE) ×3 IMPLANT
SYR CONTROL 10ML LL (SYRINGE) ×3 IMPLANT
TUBE CONNECTING 12'X1/4 (SUCTIONS) ×1
TUBE CONNECTING 12X1/4 (SUCTIONS) ×2 IMPLANT
YANKAUER SUCT BULB TIP NO VENT (SUCTIONS) ×3 IMPLANT
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
## 2020-02-19 NOTE — Anesthesia Preprocedure Evaluation (Addendum)
Anesthesia Evaluation  Patient identified by MRN, date of birth, ID band Patient awake    Reviewed: Allergy & Precautions, NPO status , Patient's Chart, lab work & pertinent test results  History of Anesthesia Complications Negative for: history of anesthetic complications  Airway Mallampati: II  TM Distance: >3 FB Neck ROM: Full    Dental  (+) Chipped, Poor Dentition, Dental Advisory Given, Missing   Pulmonary neg pulmonary ROS,  02/19/2020 SARS coronavirus NEG   breath sounds clear to auscultation       Cardiovascular (-) hypertension Rhythm:Regular Rate:Normal     Neuro/Psych negative neurological ROS     GI/Hepatic negative GI ROS, (+)     substance abuse  alcohol use,   Endo/Other  negative endocrine ROS  Renal/GU negative Renal ROS     Musculoskeletal   Abdominal   Peds  Hematology   Anesthesia Other Findings   Reproductive/Obstetrics                            Anesthesia Physical Anesthesia Plan  ASA: III  Anesthesia Plan: General   Post-op Pain Management:    Induction: Intravenous, Rapid sequence and Cricoid pressure planned  PONV Risk Score and Plan: 3 and Ondansetron, Dexamethasone and Scopolamine patch - Pre-op  Airway Management Planned: Oral ETT  Additional Equipment: None  Intra-op Plan:   Post-operative Plan: Extubation in OR  Informed Consent: I have reviewed the patients History and Physical, chart, labs and discussed the procedure including the risks, benefits and alternatives for the proposed anesthesia with the patient or authorized representative who has indicated his/her understanding and acceptance.     Dental advisory given  Plan Discussed with: CRNA and Surgeon  Anesthesia Plan Comments:         Anesthesia Quick Evaluation

## 2020-02-19 NOTE — H&P (Signed)
See consult note
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
## 2020-02-19 NOTE — Anesthesia Procedure Notes (Signed)
Procedure Name: Intubation Date/Time: 02/19/2020 8:13 PM Performed by: Oletta Lamas, CRNA Pre-anesthesia Checklist: Patient identified, Emergency Drugs available, Suction available and Patient being monitored Patient Re-evaluated:Patient Re-evaluated prior to induction Oxygen Delivery Method: Circle System Utilized Preoxygenation: Pre-oxygenation with 100% oxygen Induction Type: IV induction Ventilation: Mask ventilation without difficulty Laryngoscope Size: Mac and 4 Grade View: Grade I Tube type: Oral Tube size: 7.5 mm Number of attempts: 1 Airway Equipment and Method: Stylet and Oral airway Placement Confirmation: ETT inserted through vocal cords under direct vision,  positive ETCO2 and breath sounds checked- equal and bilateral Secured at: 22 cm Tube secured with: Tape Dental Injury: Teeth and Oropharynx as per pre-operative assessment
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
## 2020-02-19 NOTE — Transfer of Care (Signed)
Immediate Anesthesia Transfer of Care Note  Patient: Russell Calcaterra Sr.  Procedure(s) Performed: OPEN REDUCTION INTERNAL FIXATION (ORIF) ULNAR FRACTURE, I&D LEFT FOREMAN, I&D Left knee (Left )  Patient Location: PACU  Anesthesia Type:General  Level of Consciousness: awake, alert  and oriented  Airway & Oxygen Therapy: Patient Spontanous Breathing  Post-op Assessment: Report given to RN and Post -op Vital signs reviewed and stable  Post vital signs: Reviewed and stable  Last Vitals:  Vitals Value Taken Time  BP 174/113 02/19/20 2255  Temp    Pulse 111 02/19/20 2259  Resp 20 02/19/20 2259  SpO2 97 % 02/19/20 2259  Vitals shown include unvalidated device data.  Last Pain:  Vitals:   02/19/20 1929  TempSrc: Temporal  PainSc: 0-No pain         Complications: No complications documented.
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
## 2020-02-19 NOTE — ED Notes (Signed)
Assumed care on patient , waiting call from OR , patient signed consent form presented by orthopedic surgeon .

## 2020-02-20 ENCOUNTER — Telehealth: Payer: Self-pay

## 2020-02-20 ENCOUNTER — Other Ambulatory Visit: Payer: Self-pay | Admitting: Physician Assistant

## 2020-02-20 ENCOUNTER — Other Ambulatory Visit: Payer: Self-pay

## 2020-02-20 ENCOUNTER — Encounter (HOSPITAL_COMMUNITY): Payer: Self-pay | Admitting: Orthopaedic Surgery

## 2020-02-20 DIAGNOSIS — S52272E Monteggia's fracture of left ulna, subsequent encounter for open fracture type I or II with routine healing: Secondary | ICD-10-CM

## 2020-02-20 DIAGNOSIS — S81012A Laceration without foreign body, left knee, initial encounter: Secondary | ICD-10-CM

## 2020-02-20 DIAGNOSIS — S52272B Monteggia's fracture of left ulna, initial encounter for open fracture type I or II: Principal | ICD-10-CM

## 2020-02-20 LAB — URINALYSIS, ROUTINE W REFLEX MICROSCOPIC
Bilirubin Urine: NEGATIVE
Glucose, UA: NEGATIVE mg/dL
Ketones, ur: NEGATIVE mg/dL
Leukocytes,Ua: NEGATIVE
Nitrite: NEGATIVE
Protein, ur: NEGATIVE mg/dL
Specific Gravity, Urine: 1.01 (ref 1.005–1.030)
pH: 6 (ref 5.0–8.0)

## 2020-02-20 LAB — URINALYSIS, MICROSCOPIC (REFLEX): WBC, UA: NONE SEEN WBC/hpf (ref 0–5)

## 2020-02-20 LAB — ETHANOL: Alcohol, Ethyl (B): <10 mg/dL (ref <10)

## 2020-02-20 LAB — SAMPLE TO BLOOD BANK

## 2020-02-20 MED ORDER — METHOCARBAMOL 500 MG PO TABS: 500.0000 mg | ORAL_TABLET | Freq: Four times a day (QID) | ORAL | Status: DC | PRN

## 2020-02-20 MED ORDER — ONDANSETRON HCL 4 MG/2ML IJ SOLN: 4.0000 mg | Freq: Four times a day (QID) | INTRAMUSCULAR | Status: DC | PRN

## 2020-02-20 MED ORDER — ZINC SULFATE 220 (50 ZN) MG PO CAPS: 220.0000 mg | ORAL_CAPSULE | Freq: Every day | ORAL | 0 refills | Status: AC

## 2020-02-20 MED ORDER — POLYETHYLENE GLYCOL 3350 17 G PO PACK: 17.0000 g | PACK | Freq: Every day | ORAL | Status: DC | PRN

## 2020-02-20 MED ORDER — CEFAZOLIN SODIUM-DEXTROSE 2-4 GM/100ML-% IV SOLN
2.0000 g | Freq: Three times a day (TID) | INTRAVENOUS | Status: DC
  Administered 2020-02-20 (×2): 2 g via INTRAVENOUS
  Filled 2020-02-20 (×2): qty 100

## 2020-02-20 MED ORDER — DOCUSATE SODIUM 100 MG PO CAPS
100.0000 mg | ORAL_CAPSULE | Freq: Two times a day (BID) | ORAL | Status: DC
  Administered 2020-02-20: 100 mg via ORAL
  Filled 2020-02-20: qty 1

## 2020-02-20 MED ORDER — ONDANSETRON HCL 4 MG PO TABS: 4.0000 mg | ORAL_TABLET | Freq: Three times a day (TID) | ORAL | 0 refills | Status: DC | PRN

## 2020-02-20 MED ORDER — ONDANSETRON HCL 4 MG PO TABS: 4.0000 mg | ORAL_TABLET | Freq: Three times a day (TID) | ORAL | 0 refills | Status: AC | PRN

## 2020-02-20 MED ORDER — METOCLOPRAMIDE HCL 5 MG/ML IJ SOLN: 5.0000 mg | Freq: Three times a day (TID) | INTRAMUSCULAR | Status: DC | PRN

## 2020-02-20 MED ORDER — HYDROMORPHONE HCL 1 MG/ML IJ SOLN: 0.5000 mg | INTRAMUSCULAR | Status: DC | PRN

## 2020-02-20 MED ORDER — ASPIRIN 81 MG PO CHEW
81.0000 mg | CHEWABLE_TABLET | Freq: Two times a day (BID) | ORAL | Status: DC
  Administered 2020-02-20: 81 mg via ORAL
  Filled 2020-02-20: qty 1

## 2020-02-20 MED ORDER — MAGNESIUM CITRATE PO SOLN: 1.0000 | Freq: Once | ORAL | Status: DC | PRN

## 2020-02-20 MED ORDER — OXYCODONE-ACETAMINOPHEN 5-325 MG PO TABS: 1.0000 | ORAL_TABLET | Freq: Four times a day (QID) | ORAL | 0 refills | Status: DC | PRN

## 2020-02-20 MED ORDER — ACETAMINOPHEN 325 MG PO TABS: 325.0000 mg | ORAL_TABLET | Freq: Four times a day (QID) | ORAL | Status: DC | PRN

## 2020-02-20 MED ORDER — METOCLOPRAMIDE HCL 5 MG PO TABS: 5.0000 mg | ORAL_TABLET | Freq: Three times a day (TID) | ORAL | Status: DC | PRN

## 2020-02-20 MED ORDER — ACETAMINOPHEN 500 MG PO TABS
1000.0000 mg | ORAL_TABLET | Freq: Four times a day (QID) | ORAL | Status: DC
  Administered 2020-02-20 (×2): 1000 mg via ORAL
  Filled 2020-02-20 (×2): qty 2

## 2020-02-20 MED ORDER — OXYCODONE HCL ER 10 MG PO T12A
10.0000 mg | EXTENDED_RELEASE_TABLET | Freq: Two times a day (BID) | ORAL | Status: DC
  Administered 2020-02-20 (×2): 10 mg via ORAL
  Filled 2020-02-20 (×2): qty 1

## 2020-02-20 MED ORDER — MIDAZOLAM HCL 2 MG/2ML IJ SOLN
INTRAMUSCULAR | Status: AC
  Filled 2020-02-20: qty 2

## 2020-02-20 MED ORDER — OXYCODONE-ACETAMINOPHEN 5-325 MG PO TABS: 1.0000 | ORAL_TABLET | Freq: Four times a day (QID) | ORAL | 0 refills | Status: AC | PRN

## 2020-02-20 MED ORDER — ONDANSETRON HCL 4 MG PO TABS: 4.0000 mg | ORAL_TABLET | Freq: Four times a day (QID) | ORAL | Status: DC | PRN

## 2020-02-20 MED ORDER — CALCIUM CARBONATE-VITAMIN D 500-200 MG-UNIT PO TABS: 1.0000 | ORAL_TABLET | Freq: Three times a day (TID) | ORAL | 6 refills | Status: DC

## 2020-02-20 MED ORDER — KETOROLAC TROMETHAMINE 15 MG/ML IJ SOLN
15.0000 mg | Freq: Four times a day (QID) | INTRAMUSCULAR | Status: DC
  Administered 2020-02-20 (×2): 15 mg via INTRAVENOUS
  Filled 2020-02-20 (×2): qty 1

## 2020-02-20 MED ORDER — OXYCODONE HCL 5 MG PO TABS: 5.0000 mg | ORAL_TABLET | ORAL | Status: DC | PRN

## 2020-02-20 MED ORDER — OXYCODONE HCL 5 MG PO TABS: 10.0000 mg | ORAL_TABLET | ORAL | Status: DC | PRN

## 2020-02-20 MED ORDER — METHOCARBAMOL 1000 MG/10ML IJ SOLN
500.0000 mg | Freq: Four times a day (QID) | INTRAVENOUS | Status: DC | PRN
  Filled 2020-02-20: qty 5

## 2020-02-20 MED ORDER — DIPHENHYDRAMINE HCL 12.5 MG/5ML PO ELIX: 25.0000 mg | ORAL_SOLUTION | ORAL | Status: DC | PRN

## 2020-02-20 MED ORDER — SORBITOL 70 % SOLN: 30.0000 mL | Freq: Every day | Status: DC | PRN

## 2020-02-20 MED ORDER — SODIUM CHLORIDE 0.9 % IV SOLN: INTRAVENOUS | Status: DC

## 2020-02-20 NOTE — TOC CAGE-AID Note (Signed)
Transition of Care (TOC) - CAGE-AID Screening   Patient Details  Name: Russell Hicks Sr. MRN: [id number] Date of Birth: 1970-10-26  Transition of Care [facility] CM/SW Contact:    Emeterio Reeve, [place] Phone Number: 02/20/2020, 11:53 AM   Clinical Narrative:  CSW met with pt at bedside. CSW introduced self and explained her role at the hospital.  Pt reports he drinks about 3, 16oz beers a day. Pt reports he does not think he has a problem with alcohol but states his wife has encouraged him to cut back. Pt denies substance use. Pt was receptive to counseling and Scientist, clinical (histocompatibility and immunogenetics). Pt accepted resources.   CAGE-AID Screening:    Have You Ever Felt You Ought to Cut Down on Your Drinking or Drug Use?: No Have People Annoyed You By Critizing Your Drinking Or Drug Use?: Yes Have You Felt Bad Or Guilty About Your Drinking Or Drug Use?: No Have You Ever Had a Drink or Used Drugs First Thing In The Morning to Steady Your Nerves or to Get Rid of a Hangover?: No CAGE-AID Score: 1  Substance Abuse Education Offered: Yes  Substance abuse interventions: Patient Counseling, Educational Materials  Emeterio Reeve, Latanya Presser, [place] Social Worker [phone]

## 2020-02-20 NOTE — Care Management (Signed)
   [facility] Medication Assistance Card Name: Zacharee Gaddie ID (MRN): [id number] Bin: [id number] RX Group: [id number] Discharge Date: 02/20/2020 Expiration Date: 10/5/201                                           (must be filled within 7 days of discharge)      Dear   : Russell Baxter  You have been approved to have the prescriptions written by your discharging physician filled through our [facility] (Medication Assistance Through [facility]) program. This program allows for a one-time (no refills) 34-day supply of selected medications for a low copay amount.  The copay is $3.00 per prescription. For instance, if you have one prescription, you will pay $3.00; for two prescriptions, you pay $6.00; for three prescriptions, you pay $9.00; and so on.  Only certain pharmacies are participating in this program with [facility]. You will need to select one of the pharmacies from the attached list and take your prescriptions, this letter, and your photo ID to one of the participating pharmacies.   We are excited that you are able to use the [facility] program to get your medications. These prescriptions must be filled within 7 days of hospital discharge or they will no longer be valid for the [facility] program. Should you have any problems with your prescriptions please contact your case management team member at [phone] for [place]/ [facility].  Thank you, [facility] Health Care Management

## 2020-02-20 NOTE — Anesthesia Postprocedure Evaluation (Signed)
Anesthesia Post Note  Patient: Russell Hidalgo Sr.  Procedure(s) Performed: OPEN REDUCTION INTERNAL FIXATION (ORIF) ULNAR FRACTURE, I&D LEFT FOREMAN, I&D Left knee (Left )     Patient location during evaluation: PACU Anesthesia Type: General Level of consciousness: awake and alert, patient cooperative and oriented Pain management: pain level controlled Vital Signs Assessment: post-procedure vital signs reviewed and stable Respiratory status: spontaneous breathing, nonlabored ventilation, respiratory function stable and patient connected to nasal cannula oxygen Cardiovascular status: blood pressure returned to baseline and stable Postop Assessment: no apparent nausea or vomiting Anesthetic complications: no   No complications documented.  Last Vitals:  Vitals:   02/19/20 2340 02/19/20 2355  BP: (!) 185/107 (!) 166/113  Pulse: (!) 113 (!) 122  Resp: 12 14  Temp:    SpO2: 96% 97%    Last Pain:  Vitals:   02/19/20 2355  TempSrc:   PainSc: Asleep                 Ercelle Winkles,E. Maritsa Hunsucker

## 2020-02-20 NOTE — Telephone Encounter (Signed)
Tried to call patient. No answer. Left message that we need to know which pharmacy he uses so that Dr.Xu can send in medication. Ask him to call us back. -Margrett Rud
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
## 2020-02-20 NOTE — Progress Notes (Signed)
Subjective: 1 Day Post-Op Procedure(s) (LRB): OPEN REDUCTION INTERNAL FIXATION (ORIF) ULNAR FRACTURE, I&D LEFT FOREMAN, I&D Left knee (Left) Patient reports pain as moderate pain to the LUE.  Objective: Vital signs in last 24 hours: Temp:  [96.8 F (36 C)-99.3 F (37.4 C)] 98.4 F (36.9 C) (09/24 0634) Pulse Rate:  [59-122] 93 (09/24 0634) Resp:  [10-23] 19 (09/24 0634) BP: (130-185)/(78-113) 137/93 (09/24 0634) SpO2:  [94 %-99 %] 98 % (09/24 0634) Weight:  [66.4 kg-67 kg] 66.4 kg (09/24 0634)  Intake/Output from previous day: 09/23 0701 - 09/24 0700 In: 1900 [I.V.:1900] Out: 435 [Urine:400; Blood:35] Intake/Output this shift: No intake/output data recorded.  Recent Labs    02/19/20 1739 02/19/20 1828  HGB 12.1* 13.9   Recent Labs    02/19/20 1739 02/19/20 1828  WBC 8.3  --   RBC 3.86*  --   HCT 38.8* 41.0  PLT 271  --    Recent Labs    02/19/20 1739 02/19/20 1828  NA 134* 134*  K 3.8 3.9  CL 98 99  CO2 19*  --   BUN 6 4*  CREATININE 0.71 0.80  GLUCOSE 126* 127*  CALCIUM 8.8*  --    No results for input(s): LABPT, INR in the last 72 hours.  Neurologically intact Sensation intact distally Intact pulses distally Incision: moderate drainage No cellulitis present Compartment soft  LLE- dressing intact LUE- splint intact.  Moderate bloody drainage around the elbow   Assessment/Plan: 1 Day Post-Op Procedure(s) (LRB): OPEN REDUCTION INTERNAL FIXATION (ORIF) ULNAR FRACTURE, I&D LEFT FOREMAN, I&D Left knee (Left) Advance diet Up with therapy D/C IV fluids LLE- WBAT LUE- continue with splint.  Reinforce dressing as needed D/C home possibly later this afternoon after PT/OT     Cristie Hem 02/20/2020, 8:50 AM
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
## 2020-02-20 NOTE — Discharge Instructions (Signed)
Left arm- do not remove the splint.  Wear the sling for comfort. Non-weight bearing.  Elevate arm as much as possible.  Left leg- weight bearing as tolerated  1. Change dressings as needed 2. May shower but keep incisions covered and dry 3. Take stool softeners as needed 4. Take pain meds as needed

## 2020-02-20 NOTE — TOC Transition Note (Signed)
Transition of Care [facility]) - CM/SW Discharge Note   Patient Details  Name: Russell Rosander Sr. MRN: [id number] Date of Birth: 1970-06-05  Transition of Care [facility]) CM/SW Contact:  Doy Hutching, LCSW Phone Number: 02/20/2020, 2:49 PM   Clinical Narrative:    Pt stable for d/c today, he denies having regular PCP, he plans on discharge with support from family and significant other. We discussed recommendation for 3n1 for shower bench- pt does not want this at this time- feels okay with leaving today.    Final next level of care: Home/Self Care Barriers to Discharge: No Barriers Identified   Patient Goals and CMS Choice Patient states their goals for this hospitalization and ongoing recovery are:: return home today Choice offered to / list presented to : Patient  Discharge Placement Name of family member notified: pt significant other at bedside Patient and family notified of of transfer: 02/20/20  Discharge Plan and Services DME Arranged: 3-N-1 DME Agency:  (Pt declined DME) HH Arranged: NA HH Agency: NA    Readmission Risk Interventions Readmission Risk Prevention Plan 02/20/2020  Post Dischage Appt Complete  Medication Screening Complete  Transportation Screening Complete

## 2020-03-02 ENCOUNTER — Ambulatory Visit (INDEPENDENT_AMBULATORY_CARE_PROVIDER_SITE_OTHER): Payer: Self-pay

## 2020-03-02 ENCOUNTER — Ambulatory Visit: Payer: Self-pay

## 2020-03-02 ENCOUNTER — Encounter: Payer: Self-pay | Admitting: Orthopaedic Surgery

## 2020-03-02 ENCOUNTER — Ambulatory Visit (INDEPENDENT_AMBULATORY_CARE_PROVIDER_SITE_OTHER): Payer: Self-pay | Admitting: Orthopaedic Surgery

## 2020-03-02 ENCOUNTER — Other Ambulatory Visit: Payer: Self-pay | Admitting: Physician Assistant

## 2020-03-02 DIAGNOSIS — S52272B Monteggia's fracture of left ulna, initial encounter for open fracture type I or II: Secondary | ICD-10-CM

## 2020-03-02 DIAGNOSIS — S81012A Laceration without foreign body, left knee, initial encounter: Secondary | ICD-10-CM

## 2020-03-02 MED ORDER — ZINC SULFATE 220 (50 ZN) MG PO TABS: ORAL_TABLET | ORAL | 0 refills | Status: AC

## 2020-03-02 MED ORDER — OSCAL 500/200 D-3 500-200 MG-UNIT PO TABS: 1.0000 | ORAL_TABLET | Freq: Three times a day (TID) | ORAL | 3 refills | Status: AC
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
## 2020-03-23 ENCOUNTER — Ambulatory Visit: Payer: Self-pay | Admitting: Orthopaedic Surgery

## 2020-03-30 ENCOUNTER — Ambulatory Visit: Payer: Self-pay | Admitting: Orthopaedic Surgery

## 2020-04-07 ENCOUNTER — Ambulatory Visit: Payer: Self-pay | Admitting: Orthopaedic Surgery

## 2020-04-13 ENCOUNTER — Ambulatory Visit (INDEPENDENT_AMBULATORY_CARE_PROVIDER_SITE_OTHER): Payer: Self-pay | Admitting: Orthopaedic Surgery

## 2020-04-13 ENCOUNTER — Ambulatory Visit (INDEPENDENT_AMBULATORY_CARE_PROVIDER_SITE_OTHER): Payer: Self-pay

## 2020-04-13 ENCOUNTER — Encounter: Payer: Self-pay | Admitting: Orthopaedic Surgery

## 2020-04-13 DIAGNOSIS — S52272B Monteggia's fracture of left ulna, initial encounter for open fracture type I or II: Secondary | ICD-10-CM
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
## 2020-04-19 ENCOUNTER — Telehealth: Payer: Self-pay

## 2020-04-19 NOTE — Telephone Encounter (Signed)
Patients  mother in law  [place] called on behalf of patient she stated patient needs a refill for oxycodone. [phone]

## 2020-04-20 ENCOUNTER — Other Ambulatory Visit: Payer: Self-pay | Admitting: Physician Assistant

## 2020-04-20 MED ORDER — HYDROCODONE-ACETAMINOPHEN 5-325 MG PO TABS: 1.0000 | ORAL_TABLET | Freq: Every day | ORAL | 0 refills | Status: AC | PRN

## 2020-04-20 NOTE — Telephone Encounter (Signed)
I sent in [place]co

## 2020-04-20 NOTE — Telephone Encounter (Signed)
Please advise 

## 2020-04-21 NOTE — Telephone Encounter (Signed)
I left voicemail for patient advising. 

## 2020-05-12 ENCOUNTER — Other Ambulatory Visit: Payer: Self-pay | Admitting: Physician Assistant

## 2020-05-25 ENCOUNTER — Ambulatory Visit: Payer: Self-pay | Admitting: Orthopaedic Surgery

## 2020-06-08 ENCOUNTER — Ambulatory Visit (INDEPENDENT_AMBULATORY_CARE_PROVIDER_SITE_OTHER): Payer: Self-pay

## 2020-06-08 ENCOUNTER — Ambulatory Visit (INDEPENDENT_AMBULATORY_CARE_PROVIDER_SITE_OTHER): Payer: Self-pay | Admitting: Orthopaedic Surgery

## 2020-06-08 DIAGNOSIS — S52272B Monteggia's fracture of left ulna, initial encounter for open fracture type I or II: Secondary | ICD-10-CM
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
## 2020-06-08 NOTE — Addendum Note (Signed)
Addended by: Albertina Parr on: 06/08/2020 03:59 PM   Modules accepted: Orders
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
